# Patient Record
Sex: Female | Born: 1935
Health system: Southern US, Community
[De-identification: ages and names within clinical notes are randomized; demographics above are authoritative.]

---

## 2009-03-15 ENCOUNTER — Emergency Department: Payer: Self-pay | Admitting: Emergency Medicine

## 2009-12-24 ENCOUNTER — Inpatient Hospital Stay: Payer: Self-pay | Admitting: Internal Medicine

## 2011-12-15 ENCOUNTER — Ambulatory Visit: Payer: Self-pay | Admitting: Podiatry

## 2012-01-25 ENCOUNTER — Emergency Department: Payer: Self-pay | Admitting: Emergency Medicine

## 2012-01-25 LAB — URINALYSIS, COMPLETE
Blood: NEGATIVE
Granular Cast: 1
Hyaline Cast: 1
Ketone: NEGATIVE
Ph: 6 (ref 4.5–8.0)
Protein: NEGATIVE
RBC,UR: 1 /HPF (ref 0–5)
Squamous Epithelial: 1
WBC UR: 10 /HPF (ref 0–5)

## 2012-01-25 LAB — CK TOTAL AND CKMB (NOT AT ARMC)
CK, Total: 76 U/L (ref 21–215)
CK-MB: 1.4 ng/mL (ref 0.5–3.6)

## 2012-01-25 LAB — COMPREHENSIVE METABOLIC PANEL
Albumin: 4.1 g/dL (ref 3.4–5.0)
Alkaline Phosphatase: 93 U/L (ref 50–136)
Anion Gap: 8 (ref 7–16)
BUN: 13 mg/dL (ref 7–18)
Bilirubin,Total: 0.4 mg/dL (ref 0.2–1.0)
Calcium, Total: 9.3 mg/dL (ref 8.5–10.1)
Creatinine: 0.8 mg/dL (ref 0.60–1.30)
Glucose: 68 mg/dL (ref 65–99)
Osmolality: 272 (ref 275–301)
SGOT(AST): 25 U/L (ref 15–37)
SGPT (ALT): 19 U/L
Sodium: 137 mmol/L (ref 136–145)

## 2012-01-25 LAB — CBC
HCT: 38.2 % (ref 35.0–47.0)
HGB: 12.1 g/dL (ref 12.0–16.0)
MCHC: 31.8 g/dL — ABNORMAL LOW (ref 32.0–36.0)
MCV: 95 fL (ref 80–100)
Platelet: 235 10*3/uL (ref 150–440)
RBC: 4.01 10*6/uL (ref 3.80–5.20)
WBC: 8.4 10*3/uL (ref 3.6–11.0)

## 2012-05-10 ENCOUNTER — Emergency Department: Payer: Self-pay | Admitting: Emergency Medicine

## 2012-05-10 LAB — CBC
HGB: 11.7 g/dL — ABNORMAL LOW (ref 12.0–16.0)
MCV: 92 fL (ref 80–100)
Platelet: 287 10*3/uL (ref 150–440)
RDW: 13.8 % (ref 11.5–14.5)
WBC: 6.1 10*3/uL (ref 3.6–11.0)

## 2012-05-10 LAB — BASIC METABOLIC PANEL
Anion Gap: 8 (ref 7–16)
BUN: 26 mg/dL — ABNORMAL HIGH (ref 7–18)
Calcium, Total: 8.9 mg/dL (ref 8.5–10.1)
EGFR (Non-African Amer.): 32 — ABNORMAL LOW
Glucose: 139 mg/dL — ABNORMAL HIGH (ref 65–99)
Osmolality: 286 (ref 275–301)
Potassium: 3.9 mmol/L (ref 3.5–5.1)

## 2012-05-10 LAB — CK TOTAL AND CKMB (NOT AT ARMC)
CK, Total: 34 U/L (ref 21–215)
CK-MB: <0.5 ng/mL — ABNORMAL LOW (ref 0.5–3.6)

## 2012-05-19 ENCOUNTER — Inpatient Hospital Stay: Payer: Self-pay | Admitting: Internal Medicine

## 2012-05-19 LAB — COMPREHENSIVE METABOLIC PANEL
Albumin: 3 g/dL — ABNORMAL LOW (ref 3.4–5.0)
Alkaline Phosphatase: 92 U/L (ref 50–136)
Anion Gap: 13 (ref 7–16)
BUN: 24 mg/dL — ABNORMAL HIGH (ref 7–18)
Bilirubin,Total: 0.5 mg/dL (ref 0.2–1.0)
Calcium, Total: 8.9 mg/dL (ref 8.5–10.1)
Chloride: 100 mmol/L (ref 98–107)
Creatinine: 1.74 mg/dL — ABNORMAL HIGH (ref 0.60–1.30)
SGPT (ALT): 18 U/L (ref 12–78)
Total Protein: 7 g/dL (ref 6.4–8.2)

## 2012-05-19 LAB — CBC
HCT: 32.9 % — ABNORMAL LOW (ref 35.0–47.0)
MCHC: 33.6 g/dL (ref 32.0–36.0)
RBC: 3.6 10*6/uL — ABNORMAL LOW (ref 3.80–5.20)
RDW: 13.7 % (ref 11.5–14.5)
WBC: 9.7 10*3/uL (ref 3.6–11.0)

## 2012-05-19 LAB — TROPONIN I
Troponin-I: <0.02 ng/mL
Troponin-I: 0.2 ng/mL — ABNORMAL HIGH

## 2012-05-19 LAB — CK TOTAL AND CKMB (NOT AT ARMC)
CK, Total: 71 U/L (ref 21–215)
CK-MB: 2.1 ng/mL (ref 0.5–3.6)

## 2012-05-19 LAB — TSH: Thyroid Stimulating Horm: 0.952 u[IU]/mL

## 2012-05-19 LAB — MAGNESIUM: Magnesium: 2.1 mg/dL

## 2012-05-20 DIAGNOSIS — I471 Supraventricular tachycardia: Secondary | ICD-10-CM

## 2012-05-20 DIAGNOSIS — I369 Nonrheumatic tricuspid valve disorder, unspecified: Secondary | ICD-10-CM

## 2012-05-20 LAB — BASIC METABOLIC PANEL
BUN: 21 mg/dL — ABNORMAL HIGH (ref 7–18)
Co2: 24 mmol/L (ref 21–32)
Creatinine: 1.16 mg/dL (ref 0.60–1.30)
EGFR (African American): 48 — ABNORMAL LOW
EGFR (Non-African Amer.): 41 — ABNORMAL LOW
Glucose: 87 mg/dL (ref 65–99)
Osmolality: 282 (ref 275–301)
Potassium: 3.6 mmol/L (ref 3.5–5.1)

## 2012-05-20 LAB — TROPONIN I: Troponin-I: 0.15 ng/mL — ABNORMAL HIGH

## 2013-10-28 DEATH — deceased

## 2014-03-08 IMAGING — CR DG CHEST 2V
1 series · 2 of 2 positions shown · non-contrast
Comparison: none

REASON FOR EXAM: weakness, fatigue
COMMENTS:   May transport without cardiac monitor

[Series 1: w chest pa · 0.14mm/px · 2 of 2 slices shown]
[im 1/2]
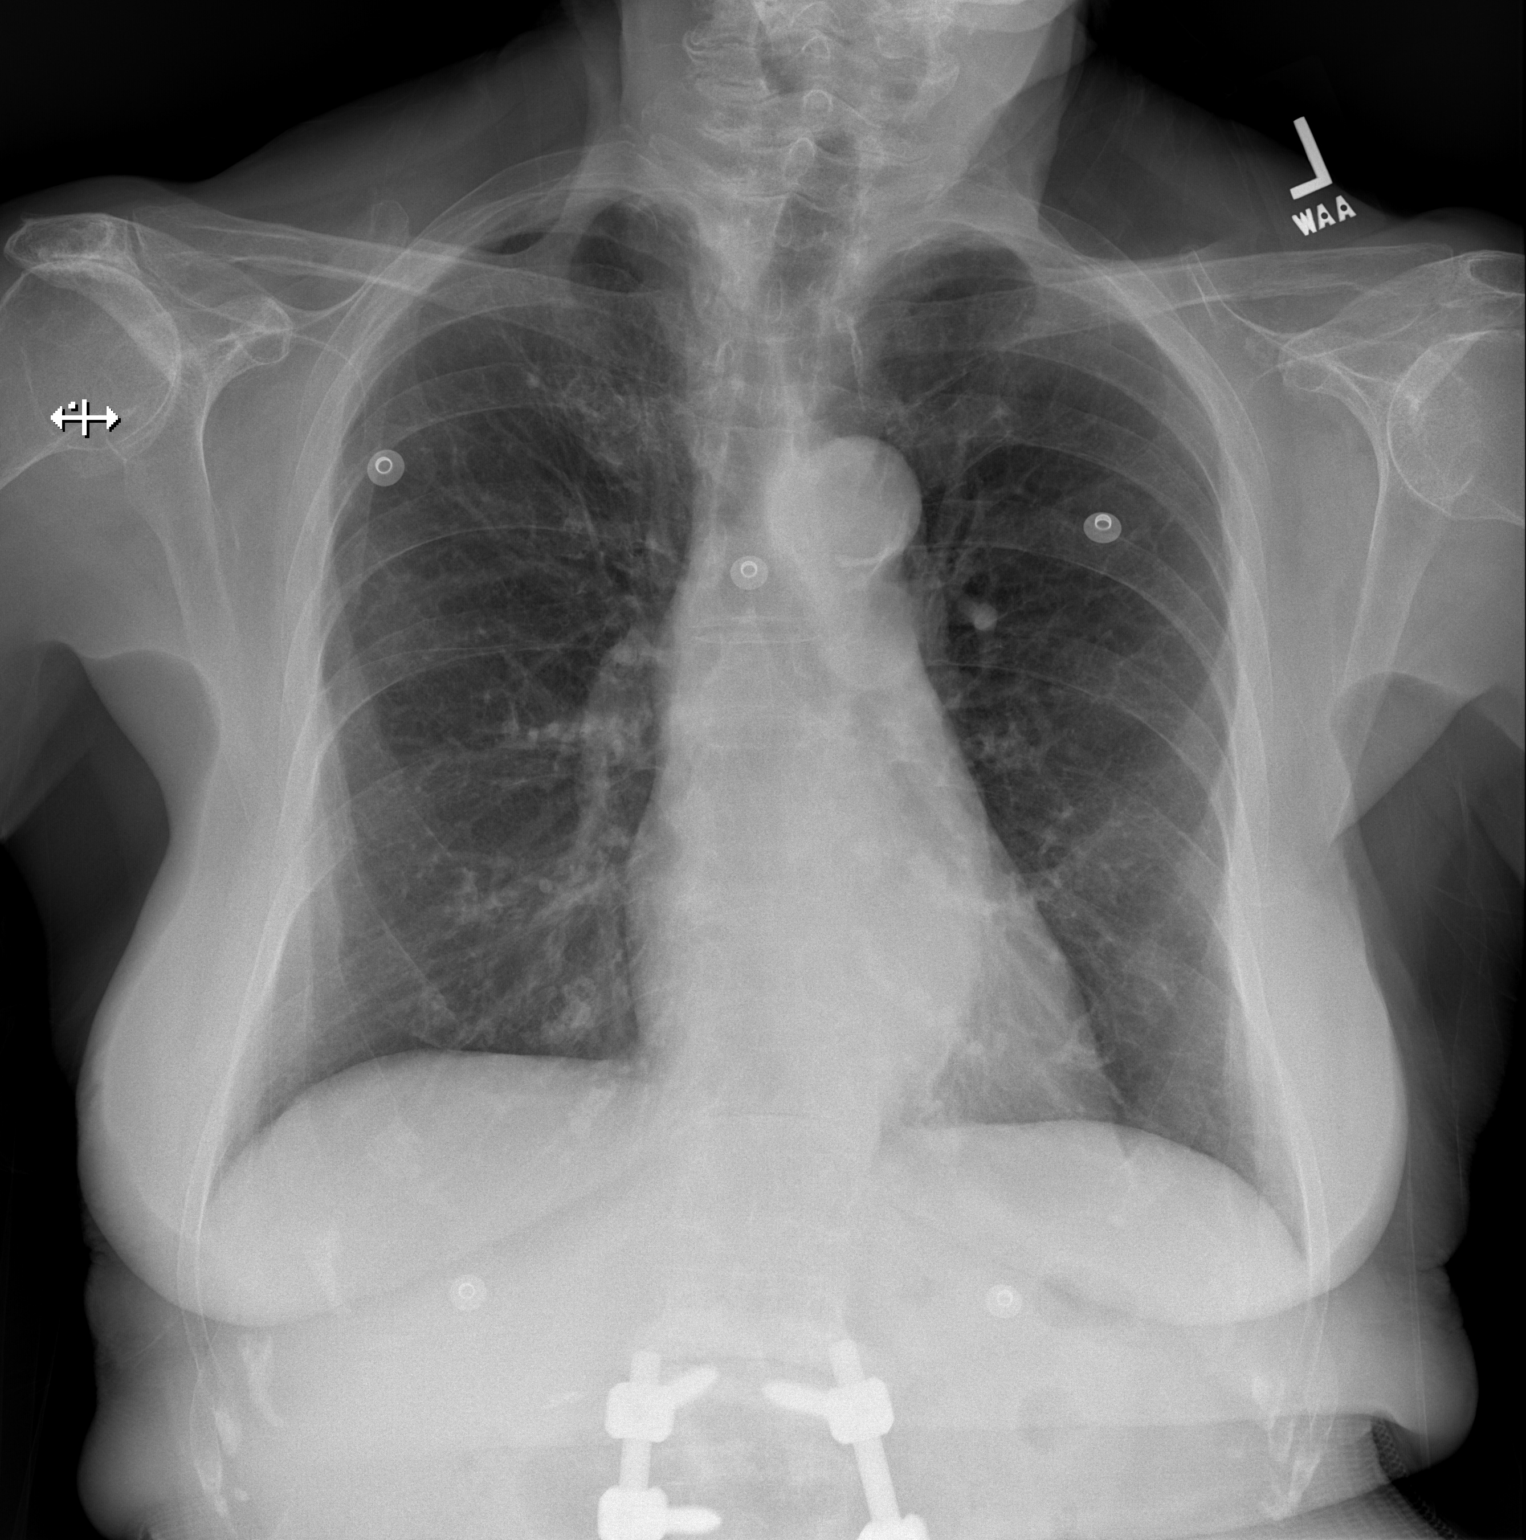
[im 2/2]
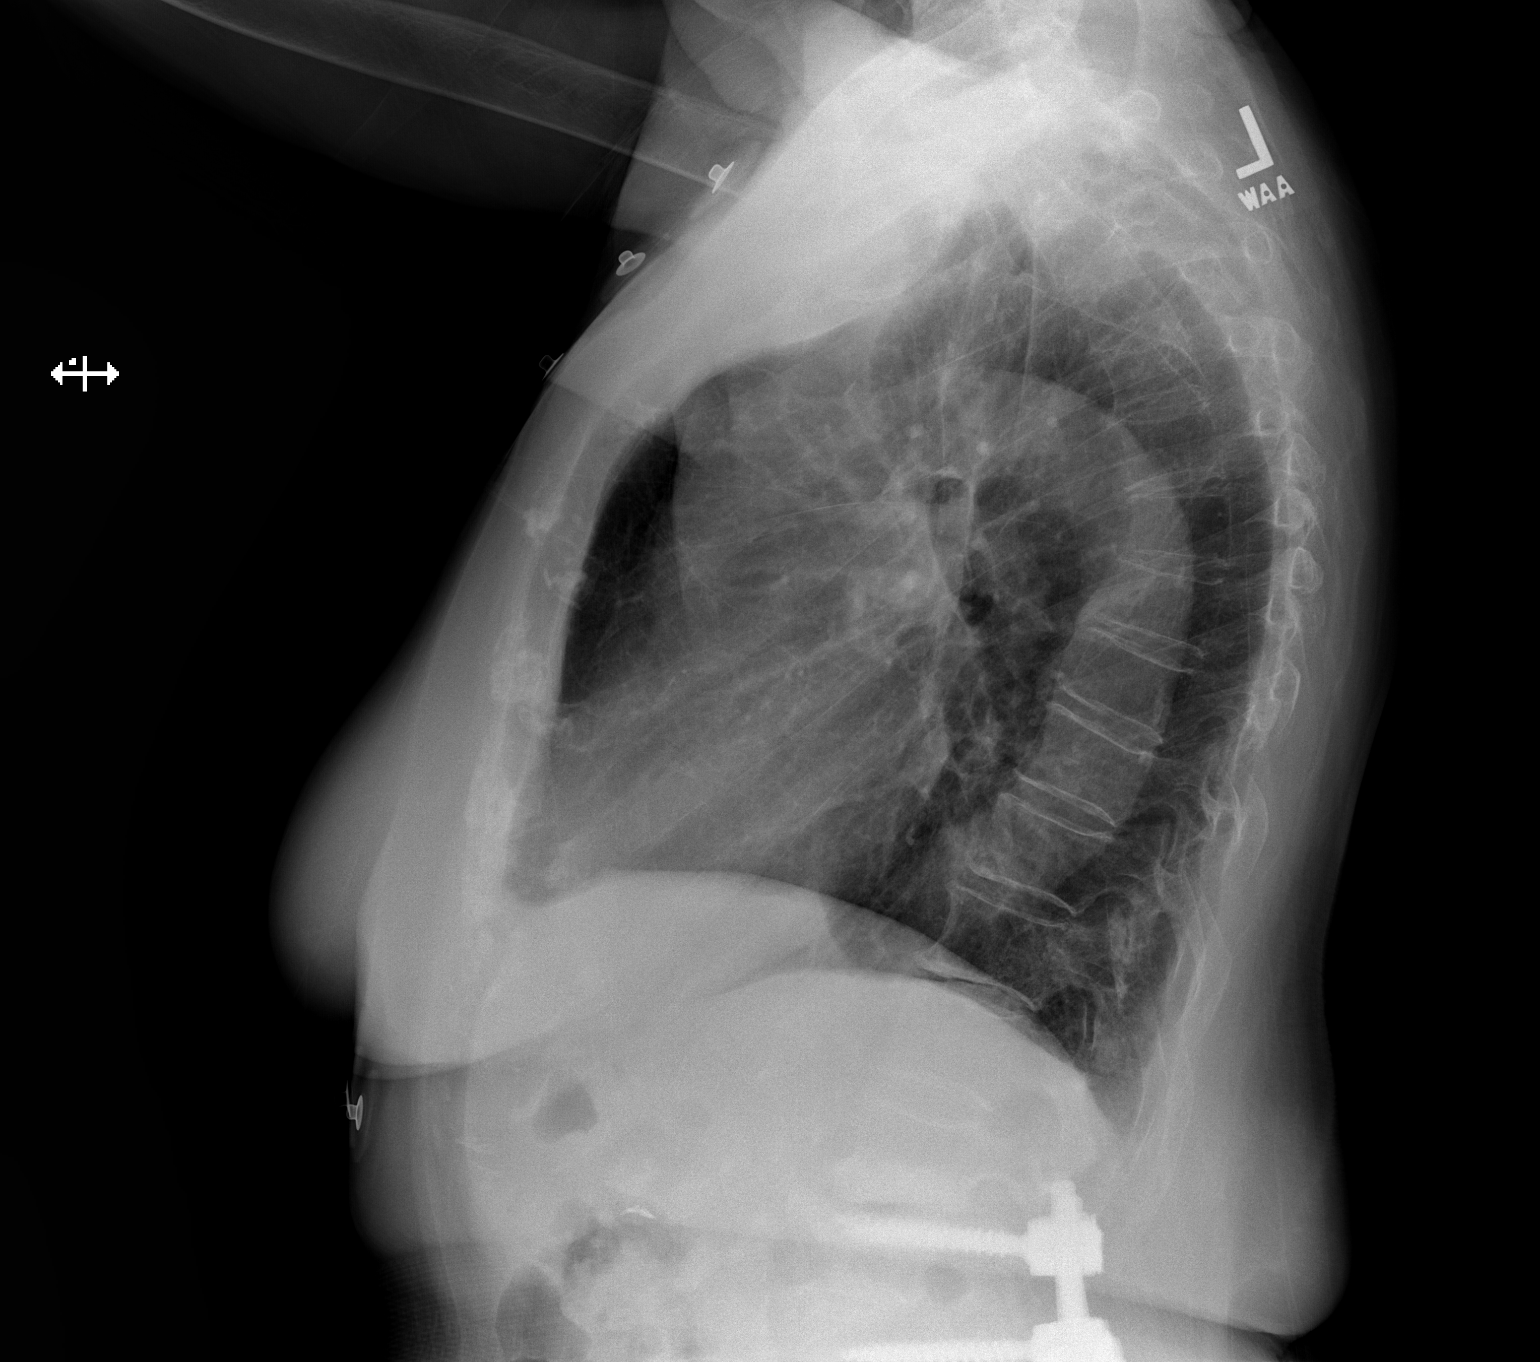

[2 of 2 positions shown; findings below may reference images not displayed]

PROCEDURE:     DXR - DXR CHEST PA (OR AP) AND LATERAL  - January 25, 2012 [DATE]

RESULT:     Comparison is made to the prior exam of 12/24/2009. The lung
fields are clear. No pneumonia, pneumothorax or pleural effusion is seen.
Heart size is normal. The mediastinal and osseous structures show no
significant abnormalities.
IMPRESSION: 1. No acute changes are identified.
2. Although not mentioned above there is observed arthritic change at the
right shoulder. There is narrowing of the space between the humeral head and
acromion which suggest rotator cuff injury.
3. COPD.

## 2014-06-22 IMAGING — CR DG CHEST 1V PORT
1 series · 1 of 1 positions shown · non-contrast
Comparison: none

REASON FOR EXAM: Chest Pain
COMMENTS:

[portable]
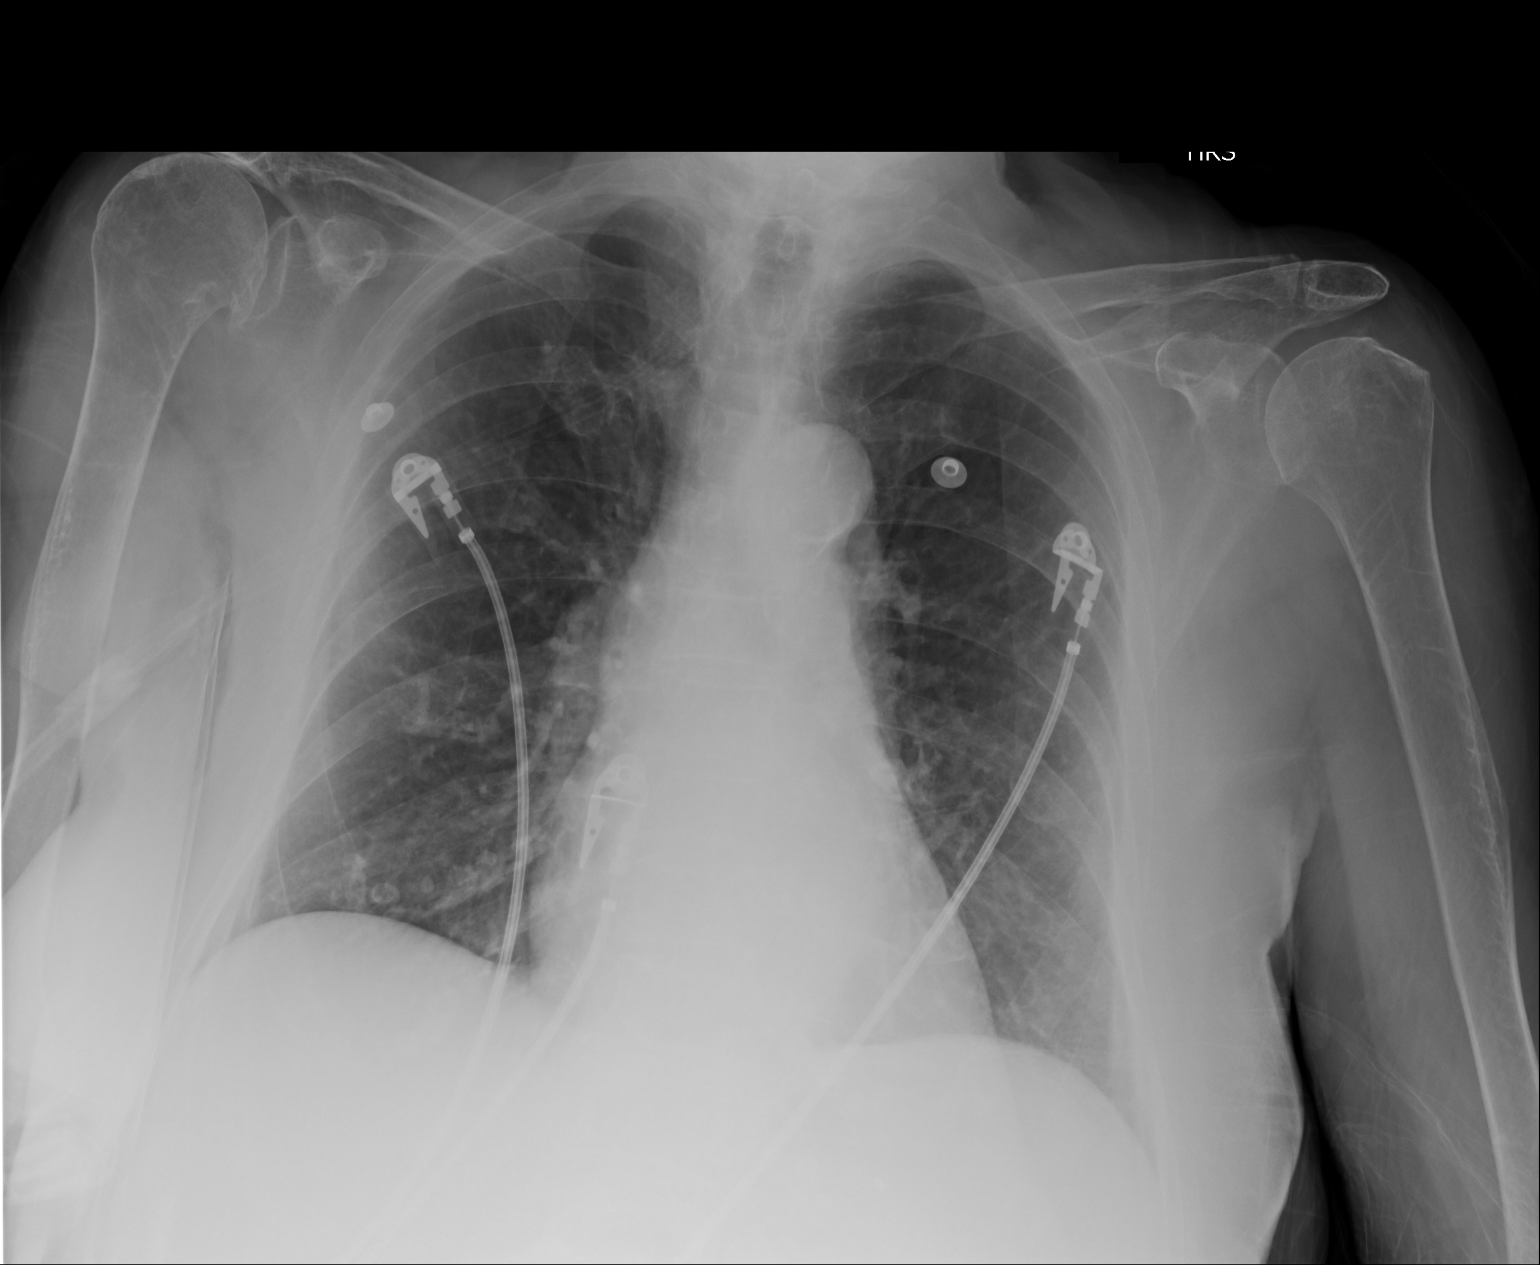

[1 of 1 positions shown; findings below may reference images not displayed]

PROCEDURE:     DXR - DXR PORTABLE CHEST SINGLE VIEW  - May 10, 2012 [DATE]

RESULT:     Comparison is made to the study dated January 25, 2012.

The heart is normal in size. Atherosclerotic calcification is demonstrated
at the aortic arch. The lungs appear clear. There is no edema, infiltrate,
effusion or pneumothorax. There is mild hyperinflation. Degenerative changes
are noted in the right shoulder.
IMPRESSION: 1.     Hyperinflation.
2.     No acute cardiopulmonary disease evident.

[REDACTED]

## 2014-07-01 IMAGING — CR DG CHEST 1V PORT
1 series · 1 of 1 positions shown · non-contrast
Comparison: none

REASON FOR EXAM: Chest Pain
COMMENTS:

PROCEDURE:     DXR - DXR PORTABLE CHEST SINGLE VIEW  - May 19, 2012  [DATE]
RESULT:     Lungs clear. Heart normal. Chest stable.

[portable]
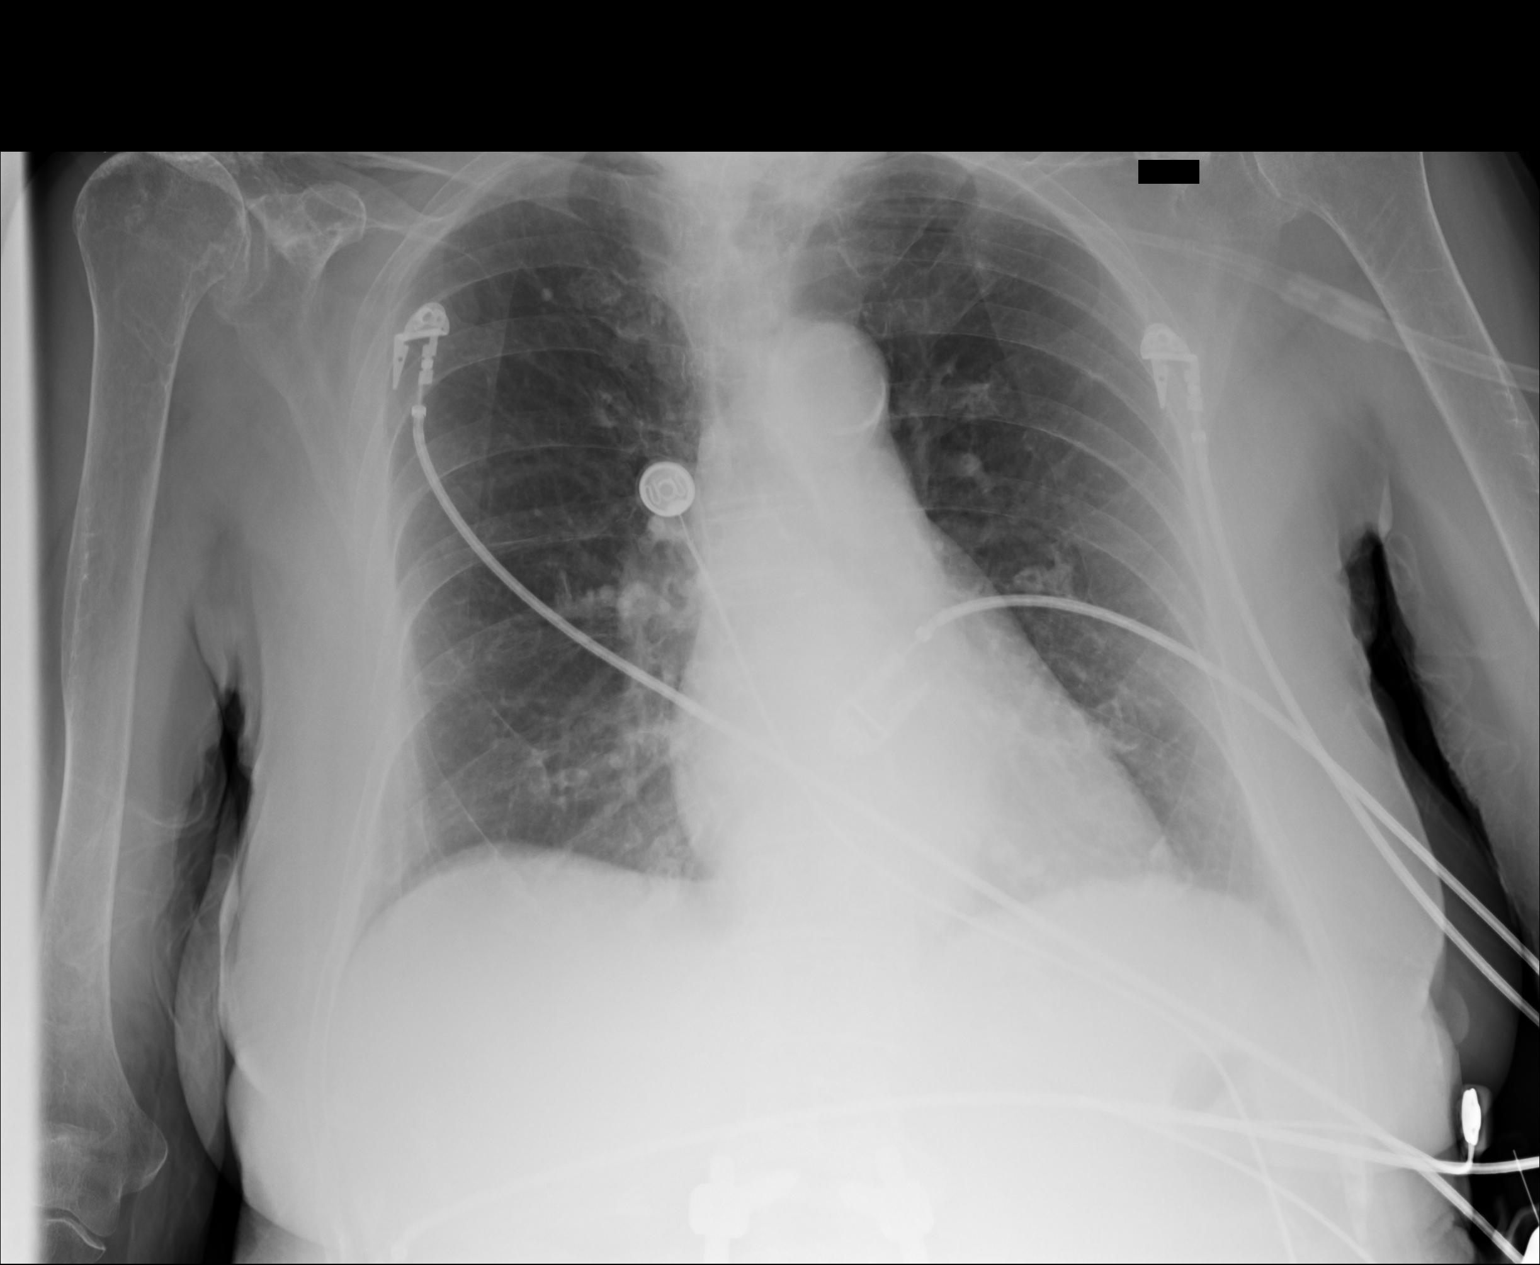

[1 of 1 positions shown; findings below may reference images not displayed]

IMPRESSION: No acute abnormality.

## 2015-01-14 NOTE — Discharge Summary (Signed)
PATIENT NAME:  Debra Chang, Debra Chang MR#:  [id number] DATE OF BIRTH:  [birth year]  DATE OF ADMISSION:  05/19/2012 DATE OF DISCHARGE:  05/21/2012  PRIMARY CARE PHYSICIAN: Dr. Rolly Pancakeobert Patterson in [place]   CONSULTATIONS:  1. Dr. Johney FrameAllred, Cardiology from [facility]  2. Dr. Guss Bundehalla of Psychiatry   FINAL DIAGNOSES:  1. Supraventricular tachycardia with syncope.  2. Elevated troponin due to supply/demand ischemia.  3. Dementia and history of cerebrovascular accident.  4. Acute renal failure, which improved.   MEDICATIONS ON DISCHARGE:  1. Tylenol 325 mg every four hours as needed for severe headache.  2. Metoprolol 25 mg 1.5 tablets twice a day. 3. Aspirin 81 mg daily.  4. Flecainide 50 mg every 12 hours.  DO NOT TAKE:  1. Hydrochlorothiazide. 2. Etodolac.  3. Prochlorperazine.   ACTIVITY: Activity as tolerated.   DIET: Low sodium diet.  FOLLOW-UP: Follow-up with Dr. Mariah MillingGollan of [facility] if you do not follow-up with cardiologist in [facility]. Keep appointment with Dr. Rolly Pancakeobert Patterson.   REASON FOR ADMISSION: The patient was admitted 05/19/2012 and discharged 05/21/2012 with chest pain and heart pounding.   HISTORY OF PRESENT ILLNESS: The patient is a 81 year old female with history of hypertension, dementia, and migraines. She was in the triage area and had a heart rate of 190. By the time an EKG was done heart rate slowed down to the 90's. She was admitted with sinus tachycardia, was placed back on her metoprolol on telemetry monitoring. Involuntary commitment papers needed to be signed on her secondary to patient wanting to leave.  LABORATORY AND RADIOLOGICAL DATA DURING THE HOSPITAL COURSE: TSH 0.952. Magnesium 2.1. BNP 1590. Glucose 130, BUN 24, creatinine 1.74, sodium 135, potassium 3.7, chloride 100, CO2 22. Liver function tests normal range. White blood cell count 9.7, hemoglobin and hematocrit 11.0 and 32.9, platelet count 328. First troponin negative.   Chest x-ray negative.    Next troponin borderline at 0.20. Next troponin borderline at 0.15. Chemistry showed glucose of 87, BUN 21, creatinine 1.16, sodium 140, potassium 3.6, chloride 105, CO2 24, calcium 8.5, GFR 41.  Ultrasound of the abdomen showed findings consistent with prior cholecystectomy.  The patient was seen in consultation by Dr. Guss Bundehalla of Psychiatry who recommended Aricept and also advised the patient staying in an environment where supervision is available secondary to poor memory and recall. The patient was seen in consultation by Dr. Johney FrameAllred of Cardiology who saw SVT on the Holter monitor and increased the metoprolol and started flecainide. He did not feel she was a good candidate for EPS study secondary to age.   HOSPITAL COURSE PER PROBLEM LIST:  1. For the patient's SVT and syncope, the patient was started on flecainide 50 mg twice a day and her metoprolol was increased. Hopefully this will keep her heart in rhythm. The family may follow-up with cardiologist in [place]. Did give the number for Dr. Mariah MillingGollan who is here locally. The patient had no arrhythmias on the telemetry monitoring here in the hospital.  2. For the patient's elevated troponin, supply/demand ischemia from rapid heart rate.  3. For the patient's dementia and history of CVA, I did not start Aricept here in the hospital since I did start flecainide. Can consider as outpatient with a history of CVA on an aspirin. The patient is going home with the daughter. Although the patient agrees to that now, she does want to live by herself which hopefully the daughter and primary care physician can address this in the future.  4. Acute renal failure. Creatinine improved with IV fluid hydration. Continue to monitor as outpatient. There is a degree of chronic kidney disease, stage III here.   TIME SPENT ON DISCHARGE: 35 minutes.   ____________________________ Herschell Dimes. Renae Gloss, MD rjw:drc D: 05/21/2012 15:58:47 ET T: 05/21/2012 16:15:13  ET JOB#: [id number]  cc: Herschell Dimes. Renae Gloss, MD, <Dictator> Dr. Rolly Pancake in [facility] Jeanella Anton MD ELECTRONICALLY SIGNED 05/24/2012 16:50

## 2015-01-14 NOTE — H&P (Signed)
PATIENT NAME:  Debra Chang, Debra Chang MR#:  [id number] DATE OF BIRTH:  01-12-1918  DATE OF ADMISSION:  05/19/2012  PRIMARY CARE PHYSICIAN: Rolly Pancake, MD, at [facility].   CHIEF COMPLAINT: Chest pain and "my heart is pounding."  HISTORY OF PRESENT ILLNESS: Debra Chang is a 81 year old Caucasian female with past medical history of hypertension, history of dementia and migraine headaches, comes to the emergency room after she started having chest tightness early this afternoon and significant headache with some transient loss of vision and palpitations. The patient tells me she went this afternoon with her friends from church out to eat. After that she went home and did some laundry and thereafter she started having severe generalized headache associated with transient loss of vision along with some chest tightness. She called her neighbor who brought her here to the emergency room. In the emergency room at the triage area vitals showed her heart rate in the 190s. By the time they did EKG her heart rate had slowed down to the 90s. Her blood pressure has been stable, and she is at present during my evaluation is having no chest pain. Her headache has improved. The patient has not gotten any medications at this time. Her vision is better as well. The patient has history of migraine headaches. She is being admitted for further evaluation and management.   Per daughter, Debra Chang who I spoke with her on the telephone., patient has been having these symptoms of nausea, diarrhea, syncopal episode with intermittent tachycardia and hence she has Holter/event monitor that was placed by her primary care physician, Dr. Rolly Pancake, at [facility] this past Tuesday, which is 3 days ago.   PAST MEDICAL HISTORY:  1. Hypertension.  2. Appendectomy.  3. Cholecystectomy.  4. Cataract extraction.  5. Hysterectomy.  6. Back surgery.  7. History of intermittent  tachycardia. The patient currently is wearing a Holter/event monitor.   MEDICATIONS: Her medications according to her daughter are: 1. Metoprolol twice a day.  2. Hydrochlorothiazide once a day.  Did not remember the dose.   ALLERGIES: Onions.   FAMILY HISTORY: Positive for hypertension.   SOCIAL HISTORY: The patient lives by herself, nonsmoker, nonalcoholic.   REVIEW OF SYSTEMS. CONSTITUTIONAL: No fever, fatigue, weakness. EYES: No blurred or double vision. The patient had some transient loss of vision which improved. ENT: No tinnitus, ear pain, hearing loss. RESPIRATORY: No cough, wheeze, COPD. CARDIOVASCULAR: No chest pain, orthopnea, edema. Positive for tachycardia. GASTROINTESTINAL: Positive for nausea, intermittent vomiting at home and some abdominal pain. No GERD. GENITOURINARY: No dysuria or hematuria. ENDOCRINE: No polyuria or nocturia. HEMATOLOGY: No anemia or easy bruising. SKIN: No acne or rash. MUSCULOSKELETAL: Positive for arthritis. NEUROLOGIC: No cerebrovascular accident, transient ischemic attack. PSYCH: No anxiety or depression. All other systems reviewed and negative.   PHYSICAL EXAMINATION:   GENERAL: Awake, alert, oriented x3, not in acute distress.   VITAL SIGNS: Her pulse is 98 to 102, regular, blood pressure is 144/70, saturations are 92% on room air.   HEENT: Atraumatic, normocephalic. PERLA.  EOMI intact.  Oral mucosa is moist.   NECK: Supple. No JVD. No carotid bruit.   RESPIRATORY: Clear to auscultation bilaterally. No rales, rhonchi, respiratory distress, or labored breathing.   CARDIOVASCULAR: Tachycardia present. No murmur heard. PMI not lateralized. Chest nontender.   EXTREMITIES: Good pedal pulses, good femoral pulses. No lower extremity edema.   ABDOMEN: Soft, benign, nontender. No organomegaly. Positive bowel sounds.   NEUROLOGIC: Grossly  intact cranial nerves II through XII. No focal motor or sensory deficit. Patient appears to be alert, oriented x2.    PSYCHIATRIC: Patient is alert and oriented.   LABORATORIES: Chest x-ray: No acute abnormality. Troponin 0.02. White count is 9.7, hemoglobin 11 and hematocrit 32.9, platelet count is 328,000. Glucose is 130, potassium 3.7, sodium is 135, creatinine is 1.74. LFTs are within normal limits. Magnesium is 2.1. TSH 0.92.   ASSESSMENT: A 81 year old Debra Chang with history of hypertension and comes in with:  1. Sinus tachycardia. She came in with some chest tightness, tachycardia with a heart rate in the 190s, slowed down to the 80s in the ER. The patient has been having, per daughter, on and off symptoms of presyncopal episode with intermittent tachycardia. She is recently had a Holter monitor placed for the past t3 days given by primary care physician to evaluate episodes of possible syncope and tachycardia at home. We will admit patient to telemetry floor.  2. A 2 grams sodium diet.  3. We will continue patient's metoprolol 25 mg b.i.d. We will cycle cardiac enzymes x3 and watch for any arrhythmias on telemetry. Will also have cardiology consultation.  4. Presyncope suspected to intermittent tachycardia and mild dehydration. The patient is on hydrochlorothiazide. I will hold off on it for now. Her blood pressure is stable.  5. Persistent nausea with abdominal pain after eating food. We will check ultrasound of the abdomen. Her LFTs appear normal at this time.  6. Dementia. Per daughter, the patient has been forgetting, has been repeating things on and off.  She was also threatening to leave against medical advice and go home, unsafe to go home at this time until her workup is completed. Hence ER MD got IVC commitment papers. We will have sitter in the room and if needed consider a psychiatric consultation. 7. Above was discussed with patient, patient's daughter.   CODE STATUS: THE PATIENT IS A FULL CODE FOR NOW.   TIME SPENT: 50 minutes.    ____________________________ Wylie HailSona A. Allena KatzPatel,  MD sap:vtd D: 05/19/2012 19:12:59 ET   T: 05/20/2012 05:52:32 ET   JOB#: [id number] cc: Debra Chang A. Allena KatzPatel, MD, <Dictator> Rolly Pancakeobert Patterson, MD, [facility] OraSONA A Malachi Suderman MD ELECTRONICALLY SIGNED 05/20/2012 14:12

## 2015-01-14 NOTE — Consult Note (Signed)
  [name] is a 81-year-old Caucasian female with hypertension who presents with symptomatic SVT.  She reports that over the past few months that she has had abrupt onset/offset of tachypalpitations.  She reports associated headache and fatigue.  Occasionally, as the episode progresses she develops presyncope.  She reports having an episodes of syncope associated with her tachycardia last week for which her primary care physician at [facility] placed an event monitor.  She has been taking metoprolol without improvement.  She has tried vagal maneuvers and has not found them to be helpful. On ROS, she reports nausea and diarrhea chroniclly.  She was occasional edema for which she has previously been treated with hctz.  In the emergency room at the triage area vitals showed her heart rate in the 190s. By the time an EKG was obtained, her heart rate had slowed down to the 90s. Her blood pressure has been stable, and she is at present during my evaluation is having no chest pain.  She has been observed on telemetry without further arrhythmias.  Presently, she is resing comfortable and is without complaint.    Present Illness PMH 1. Hypertension.  2. Appendectomy.  3. Cholecystectomy.  4. Cataract extraction.  5. Hysterectomy.  6. Back surgery.  Meds 1. metoprolol 2. hctz  Allergies:   NKDA  FAMILY HISTORY: Positive for hypertension.   SOCIAL HISTORY: The patient lives by herself, nonsmoker, nonalcoholic.   ROS- all systems reviewed and negative except as per HPI above   Physical Exam:   GEN well developed, well nourished, ambulating without difficult    HEENT PERRL, Oropharynx clear    NECK supple    RESP normal resp effort  clear BS    CARD Regular rate and rhythm  No murmur    ABD denies tenderness  normal BS    LYMPH negative neck    EXTR negative cyanosis/clubbing, negative edema    SKIN normal to palpation    NEURO motor/sensory function intact     PSYCH alert   Lab Results: Thyroid:  23-Aug-13 14:49    Thyroid Stimulating Hormone 0.952 (0.45-4.50 (International Unit)  ----------------------- Pregnant patients have  different reference  ranges for TSH:  - - - - - - - - - -  Pregnant, first trimetser:  0.36 - 2.50 uIU/mL)  Hepatic:  23-Aug-13 14:49    Bilirubin, Total 0.5   Alkaline Phosphatase 92   SGPT (ALT) 18   SGOT (AST) 29   Total Protein, Serum 7.0   Albumin, Serum  3.0  Routine Chem:  23-Aug-13 14:49    Glucose, Serum  130   BUN  24   Creatinine (comp)  1.74   Sodium, Serum  135   Potassium, Serum 3.7   Chloride, Serum 100   CO2, Serum 22   Calcium (Total), Serum 8.9   Anion Gap 13   Osmolality (calc) 276   eGFR (African American)  29   eGFR (Non-African American)  25 (eGFR values <60mL/min/1.73 m2 may be an indication of chronic kidney disease (CKD). Calculated eGFR is useful in patients with stable renal function. The eGFR calculation will not be reliable in acutely ill patients when serum creatinine is changing rapidly. It is not useful in  patients on dialysis. The eGFR calculation may not be applicable to patients at the low and high extremes of body sizes, pregnant women, and vegetarians.)   B-Type Natriuretic Peptide ([facility])  1590 (Result(s) reported on 19 May 2012 at 03:55PM.)     Magnesium, Serum 2.1 (1.8-2.4 THERAPEUTIC RANGE: 4-7 mg/dL TOXIC: > 10 mg/dL  -----------------------)    21:51    Result Comment TROPONIN - RESULTS VERIFIED BY REPEAT TESTING.  - C/DOLL FERGUSON,RN 05/19/2012_0  CBF  - READ-BACK PROCESS PERFORMED.  Result(s) reported on 19 May 2012 at 11:01PM.  24-Aug-13 05:27    Result Comment troponin - previously called/05-19-12/2300/cbf  - RESULTS VERIFIED BY REPEAT TESTING.  Result(s) reported on 20 May 2012 at 08:26AM.   Glucose, Serum 87   BUN  21   Creatinine (comp) 1.16   Sodium, Serum 140   Potassium, Serum 3.6   Chloride, Serum 105   CO2, Serum 24   Calcium  (Total), Serum 8.5   Anion Gap 11   Osmolality (calc) 282   eGFR (African American)  48   eGFR (Non-African American)  41 (eGFR values <26m/min/1.73 m2 may be an indication of chronic kidney disease (CKD). Calculated eGFR is useful in patients with stable renal function. The eGFR calculation will not be reliable in acutely ill patients when serum creatinine is changing rapidly. It is not useful in  patients on dialysis. The eGFR calculation may not be applicable to patients at the low and high extremes of body sizes, pregnant women, and vegetarians.)  Cardiac:  23-Aug-13 14:49    CK, Total 89   CPK-MB, Serum 1.7 (Result(s) reported on 19 May 2012 at 03:55PM.)   Troponin I < 0.02 (0.00-0.05 0.05 ng/mL or less: NEGATIVE  Repeat testing in 3-6 hrs  if clinically indicated. >0.05 ng/mL: POTENTIAL  MYOCARDIAL INJURY. Repeat  testing in 3-6 hrs if  clinically indicated. NOTE: An increase or decrease  of 30% or more on serial  testing suggests a  clinically important change)    21:51    CK, Total 71   CPK-MB, Serum 2.1 (Result(s) reported on 19 May 2012 at 10:40PM.)   Troponin I  0.20 (0.00-0.05 0.05 ng/mL or less: NEGATIVE  Repeat testing in 3-6 hrs  if clinically indicated. >0.05 ng/mL: POTENTIAL  MYOCARDIAL INJURY. Repeat  testing in 3-6 hrs if  clinically indicated. NOTE: An increase or decrease  of 30% or more on serial  testing suggests a  clinically important change)  24-Aug-13 05:27    CK, Total 58   CPK-MB, Serum 1.8 (Result(s) reported on 20 May 2012 at 08:10AM.)   Troponin I  0.15 (0.00-0.05 0.05 ng/mL or less: NEGATIVE  Repeat testing in 3-6 hrs  if clinically indicated. >0.05 ng/mL: POTENTIAL  MYOCARDIAL INJURY. Repeat  testing in 3-6 hrs if  clinically indicated. NOTE: An increase or decrease  of 30% or more on serial  testing suggests a  clinically important change)  Routine Hem:  23-Aug-13 14:49    WBC (CBC) 9.7   RBC (CBC)  3.60   Hemoglobin  (CBC)  11.0   Hematocrit (CBC)  32.9   Platelet Count (CBC) 328 (Result(s) reported on 19 May 2012 at 03:18PM.)   MCV 92   MCH 30.7   MCHC 33.6   RDW 13.7   EKG:   EKG sinus rhythm 98 bpm, PR 132, QRS 60, Qtc 444, otherwise normal ekg    Onions: Headaches, N/V/Diarrhea  Vital Signs/Nurse's Notes: **Vital Signs.:   24-Aug-13 15:51   Vital Signs Type Routine   Temperature Temperature (F) 98.9   Celsius 37.1   Temperature Source Oral   Respirations Respirations 20   Systolic BP Systolic BP 1697  Diastolic BP (mmHg) Diastolic BP (mmHg) 74   Mean BP  102   BP Source  if not from Vital Sign Device non-invasive   Systolic BP Systolic BP 147   Diastolic BP (mmHg) Diastolic BP (mmHg) 66   Pulse Lying Pulse Lying 81   Systolic BP Systolic BP 151   Diastolic BP (mmHg) Diastolic BP (mmHg) 77   Pulse Pulse Sitting 84   Systolic BP Systolic BP 166   Diastolic BP (mmHg) Diastolic BP (mmHg) 80   Pulse Standing Pulse Standing 88   Pulse Ox % Pulse Ox % 94   Pulse Ox Activity Level  At rest   Oxygen Delivery Room Air/ 21 %     Impression 81 year old female with h/o HTN now presents with palpitations and presyncope. I have reviewed her event monitor which reveals that her symptoms are due to SVT.  She has a narrow complex tachycarda documented yesterday at 190 bpm.  Prior to the episode, she had PACs and a "warm up" type phenomenon which leads me to favor a diagnosis of atrial tachycardia over AVNRT. She is clearly symptomatic with these events.  Given her advanced age, she is not a great candidate for EP study.  I would therefore plan medical therapy long term. Increase metoprolol to 37.5mg BID add flecainide 50mg BID stop hctz    Plan She is very clear that she would like to go home today.  I have instructed her to remain in the hospital overnight while we initiate flecainide.  If her rhythm remains stable overnight, then she could be discharged by the hospitalist in the am with  outpatient follow-up.  As she is moving to [place] to live with her daughter, she would like to establish care with cardiology at [facility].  If she decides to follow-up with [facility] instead then she could be seen by Dr Gollan/ Arida in our [place] office in 2-3 weeks.  If she tolerates flecainide overnight, then she will DC in the am.  I will see her as needed while here.   Electronic Signatures: Allred, James D (MD)  (Signed 24-Aug-13 17:19)  Authored: General Aspect/Present Illness, History and Physical Exam, Labs, EKG , Allergies, Vital Signs/Nurse's Notes, Impression/Plan   Last Updated: 24-Aug-13 17:19 by Allred, James D (MD) 

## 2015-01-14 NOTE — Consult Note (Signed)
PATIENT NAME:  Debra Chang, YOUNGREN MR#:  [id number] DATE OF BIRTH:  [birth year]  DATE OF CONSULTATION:  05/20/2012  REFERRING PHYSICIAN:   CONSULTING PHYSICIAN:  Lyndsee Casa K. Ayannah Faddis, MD  SUBJECTIVE: The patient is a 81 year old white female who was living with her family that consists of her daughter, son-in-law, and granddaughter. 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 The patient is widowed for 22 years and lives by herself in a house. According to the information obtained from the daughter and son-in-law, the patient has been taking care of herself but recently she started having heart problems and she has had several episodes of her heart getting rapid and so she was admitted for evaluation and help.   CHIEF COMPLAINT: "Having episodes with heart getting rapid and here for help".  HISTORY OF PRESENT ILLNESS: Family reports that the patient lives by herself and is able to manage to take care of herself but recently with the above-mentioned medical problems she's having trouble taking care of herself. They checked into home health care and found out that she cannot afford the same as she will not have enough money left after paying for home health care and daughter and son-in-law want to take her home to live with them and she will have a private room and bathroom but the patient is refusing as she has been independent all her life and at the age of 39 years she had been living in her house by herself.   PAST PSYCHIATRIC HISTORY: No previous history of inpatient hospitalizations on psychiatry. No history of suicide attempt. Not being followed by any psychiatrist. Recently she had been feeling low and down when she was asked by her family to move in with them.   ALCOHOL AND DRUGS: Denied.   MENTAL STATUS EXAMINATION: The patient is dressed in Engineer, drilling. Hair is groomed. Bold makeup. Alert and very pleasant and cheerful and cooperative. Is hard of hearing and daughter had to  stay close to the patient to explain the questions. She said that she was in a rest home. She knew the name of the state of [place], name of the capital as [place]. She knew the country was [place], knew that [place], [place] was the capital but could not name the current president and asked for some more time to name the current president. Affect and mood stable. Denies feeling depressed. Denies feeling hopeless or helpless. Family reports that recently she has been low and down when she was mentioned about having to move in with her daughter and son-in-law. Denies any crying spells. Denies any ideas or plans to hurt herself or others or any wishes to hurt herself. No psychosis. Denies auditory or visual hallucinations. Denies any paranoid or suspicious ideas. She could spell the word world forward and backward though she took some time. She could count money and she knew four quarters, ten dimes, 20 nickels, and 100 pennies were in one dollar. Regarding judgment for a stamped and addressed envelope, she reported she would put it in the mailbox. For a fire, she reported she would pour water on that. Regarding memory and recall which is poor, she could not remember and repeat any of the three objects that she was asked to remember after a few minutes and several minutes and was frustrated about the same. Insight and judgment fair.   IMPRESSION: Mild cognitive impairment.   PLAN AND RECOMMENDATION:  1. Start patient on Aricept 10 mg p.o. at bedtime with food. 2.  The patient needs to be staying in an environment where help and supervision is available because of memory and recall which is poor and she is likely to get into problems in the future. Otherwise, the patient's cognitive function is within normal limits. The patient does have difficulty in accepting the living situation and needs help with the same. Psychological testing can be done so that level of functioning can be obtained and the patient can be  explained about the same.   ____________________________ Jannet MantisSurya K. Guss Bundehalla, MD skc:drc D: 05/20/2012 17:47:45 ET T: 05/21/2012 10:41:48 ET JOB#: [id number]  cc: Monika SalkSurya K. Guss Bundehalla, MD, <Dictator> Beau FannySURYA K Roselyn Doby MD ELECTRONICALLY SIGNED 05/21/2012 18:55
